# Patient Record
Sex: Male | Born: 1960 | Race: Black or African American | Hispanic: No | Marital: Married
Health system: Southern US, Community
[De-identification: ages and names within clinical notes are randomized; demographics above are authoritative.]

---

## 2000-06-20 ENCOUNTER — Ambulatory Visit (HOSPITAL_BASED_OUTPATIENT_CLINIC_OR_DEPARTMENT_OTHER): Admission: RE | Admit: 2000-06-20 | Discharge: 2000-06-20 | Payer: Self-pay | Admitting: Family Medicine

## 2001-06-13 ENCOUNTER — Encounter: Payer: Self-pay | Admitting: Emergency Medicine

## 2001-06-13 ENCOUNTER — Emergency Department (HOSPITAL_COMMUNITY): Admission: EM | Admit: 2001-06-13 | Discharge: 2001-06-13 | Payer: Self-pay | Admitting: Emergency Medicine

## 2002-04-27 ENCOUNTER — Encounter: Payer: Self-pay | Admitting: Family Medicine

## 2002-04-27 ENCOUNTER — Ambulatory Visit: Admission: RE | Admit: 2002-04-27 | Discharge: 2002-04-27 | Payer: Self-pay | Admitting: Family Medicine

## 2013-08-25 ENCOUNTER — Encounter: Payer: Self-pay | Admitting: Podiatry

## 2013-08-25 NOTE — Progress Notes (Signed)
Patient ID: Joshua Sawyer, male   DOB: [birth year], 52 y.o.   MRN: [id number] DISPENSED REFURBISHED ORTHOTICS

## 2015-10-15 DIAGNOSIS — M722 Plantar fascial fibromatosis: Secondary | ICD-10-CM

## 2017-10-12 DIAGNOSIS — M79676 Pain in unspecified toe(s): Secondary | ICD-10-CM

## 2020-04-30 DIAGNOSIS — M79676 Pain in unspecified toe(s): Secondary | ICD-10-CM

## 2020-06-25 ENCOUNTER — Telehealth: Payer: Self-pay | Admitting: Orthotics

## 2020-06-25 NOTE — Telephone Encounter (Signed)
Pt left message asking about the refurbished orthotics he dropped off 2 months ago.  I returned call and told pt that orthotics are ready for pick up and that I had left message 6.18.2021 that they were back.

## 2020-07-06 ENCOUNTER — Emergency Department (HOSPITAL_COMMUNITY): Payer: 59

## 2020-07-06 ENCOUNTER — Other Ambulatory Visit: Payer: Self-pay

## 2020-07-06 ENCOUNTER — Encounter (HOSPITAL_COMMUNITY): Payer: Self-pay

## 2020-07-06 DIAGNOSIS — Y939 Activity, unspecified: Secondary | ICD-10-CM | POA: Insufficient documentation

## 2020-07-06 DIAGNOSIS — S060X1A Concussion with loss of consciousness of 30 minutes or less, initial encounter: Secondary | ICD-10-CM | POA: Diagnosis not present

## 2020-07-06 DIAGNOSIS — Y999 Unspecified external cause status: Secondary | ICD-10-CM | POA: Insufficient documentation

## 2020-07-06 DIAGNOSIS — X58XXXA Exposure to other specified factors, initial encounter: Secondary | ICD-10-CM | POA: Diagnosis not present

## 2020-07-06 DIAGNOSIS — Y9289 Other specified places as the place of occurrence of the external cause: Secondary | ICD-10-CM | POA: Diagnosis not present

## 2020-07-06 DIAGNOSIS — S0990XA Unspecified injury of head, initial encounter: Secondary | ICD-10-CM | POA: Diagnosis present
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
## 2020-07-07 ENCOUNTER — Emergency Department (HOSPITAL_COMMUNITY)
Admission: EM | Admit: 2020-07-07 | Discharge: 2020-07-07 | Disposition: A | Discharge status: Home / Self Care | Payer: 59 | Attending: Emergency Medicine | Admitting: Emergency Medicine

## 2020-07-07 DIAGNOSIS — S060X9A Concussion with loss of consciousness of unspecified duration, initial encounter: Secondary | ICD-10-CM
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
## 2021-10-23 DIAGNOSIS — M79676 Pain in unspecified toe(s): Secondary | ICD-10-CM

## 2021-11-27 ENCOUNTER — Telehealth: Payer: Self-pay | Admitting: Podiatry

## 2021-11-27 NOTE — Telephone Encounter (Signed)
Refurbished orthotics in.. pt aware ok to pick up. 

## 2023-07-17 ENCOUNTER — Other Ambulatory Visit: Payer: Self-pay | Admitting: Gastroenterology

## 2023-07-17 ENCOUNTER — Ambulatory Visit
Admission: RE | Admit: 2023-07-17 | Discharge: 2023-07-17 | Disposition: A | Discharge status: Home / Self Care | Payer: Managed Care, Other (non HMO) | Source: Ambulatory Visit | Attending: Gastroenterology | Admitting: Gastroenterology

## 2023-07-17 DIAGNOSIS — K5900 Constipation, unspecified: Secondary | ICD-10-CM

## 2024-04-28 ENCOUNTER — Telehealth: Payer: Self-pay

## 2024-04-28 NOTE — Telephone Encounter (Signed)
 Orthotics sent to [facility] for refurbish need to collect $90 / admin charge needs to be added

## 2024-05-20 ENCOUNTER — Telehealth: Payer: Self-pay | Admitting: Podiatry

## 2024-05-20 NOTE — Telephone Encounter (Signed)
 Orthotics are here. Called pt. No appt needed.  $90 due upon pu

## 2024-05-23 DIAGNOSIS — M79676 Pain in unspecified toe(s): Secondary | ICD-10-CM

## 2024-05-24 ENCOUNTER — Ambulatory Visit
Admission: RE | Admit: 2024-05-24 | Discharge: 2024-05-24 | Disposition: A | Discharge status: Home / Self Care | Source: Ambulatory Visit | Attending: Gastroenterology | Admitting: Gastroenterology

## 2024-05-24 ENCOUNTER — Other Ambulatory Visit: Payer: Self-pay | Admitting: Gastroenterology

## 2024-05-24 DIAGNOSIS — K5900 Constipation, unspecified: Secondary | ICD-10-CM
